# Patient Record
Sex: Female | Born: 1995 | Race: White | Hispanic: No | Marital: Single
Health system: Southern US, Community
[De-identification: ages and names within clinical notes are randomized; demographics above are authoritative.]

---

## 2019-06-09 ENCOUNTER — Other Ambulatory Visit: Payer: Self-pay

## 2019-06-09 DIAGNOSIS — Z20822 Contact with and (suspected) exposure to covid-19: Secondary | ICD-10-CM

## 2019-06-11 LAB — NOVEL CORONAVIRUS, NAA: SARS-CoV-2, NAA: NOT DETECTED

## 2019-11-16 ENCOUNTER — Ambulatory Visit: Payer: Self-pay | Attending: Internal Medicine

## 2019-11-16 DIAGNOSIS — Z23 Encounter for immunization: Secondary | ICD-10-CM

## 2019-11-16 NOTE — Progress Notes (Signed)
   Covid-19 Vaccination Clinic  Name:  Tamara Gallegos    MRN: [id number] DOB: Jun 05, 1996  11/16/2019  [name] was observed post Covid-19 immunization for 15 minutes without incident. She was provided with Vaccine Information Sheet and instruction to access the V-Safe system.   [name] was instructed to call 911 with any severe reactions post vaccine: Marland Kitchen Difficulty breathing  . Swelling of face and throat  . A fast heartbeat  . A bad rash all over body  . Dizziness and weakness   Immunizations Administered    Name Date Dose VIS Date Route   Pfizer COVID-19 Vaccine 11/16/2019 12:37 PM 0.3 mL 08/07/2019 Intramuscular   Manufacturer: [employer], [employer]   Lot: [id number]   NDC: [address]-6

## 2019-12-07 ENCOUNTER — Ambulatory Visit: Payer: Self-pay | Attending: Internal Medicine

## 2019-12-07 DIAGNOSIS — Z23 Encounter for immunization: Secondary | ICD-10-CM

## 2019-12-07 NOTE — Progress Notes (Signed)
   Covid-19 Vaccination Clinic  Name:  Tamara Gallegos    MRN: [id number] DOB: [birth year]  12/07/2019  Tamara Gallegos was observed post Covid-19 immunization for 15 minutes without incident. She was provided with Vaccine Information Sheet and instruction to access the V-Safe system.   Tamara Gallegos was instructed to call 911 with any severe reactions post vaccine: Marland Kitchen Difficulty breathing  . Swelling of face and throat  . A fast heartbeat  . A bad rash all over body  . Dizziness and weakness   Immunizations Administered    Name Date Dose VIS Date Route   Pfizer COVID-19 Vaccine 12/07/2019 12:09 PM 0.3 mL 08/07/2019 Intramuscular   Manufacturer: [employer], [employer]   Lot: [id number]   NDC: [address]-0

## 2021-01-01 ENCOUNTER — Emergency Department
Admission: EM | Admit: 2021-01-01 | Discharge: 2021-01-01 | Disposition: A | Discharge status: Home / Self Care | Payer: 59 | Attending: Emergency Medicine | Admitting: Emergency Medicine

## 2021-01-01 ENCOUNTER — Encounter: Payer: Self-pay | Admitting: Emergency Medicine

## 2021-01-01 ENCOUNTER — Other Ambulatory Visit: Payer: Self-pay

## 2021-01-01 ENCOUNTER — Emergency Department: Payer: 59

## 2021-01-01 DIAGNOSIS — S9032XA Contusion of left foot, initial encounter: Secondary | ICD-10-CM | POA: Insufficient documentation

## 2021-01-01 DIAGNOSIS — S52022A Displaced fracture of olecranon process without intraarticular extension of left ulna, initial encounter for closed fracture: Secondary | ICD-10-CM

## 2021-01-01 DIAGNOSIS — W108XXA Fall (on) (from) other stairs and steps, initial encounter: Secondary | ICD-10-CM | POA: Diagnosis not present

## 2021-01-01 DIAGNOSIS — S59902A Unspecified injury of left elbow, initial encounter: Secondary | ICD-10-CM | POA: Diagnosis present

## 2021-01-01 DIAGNOSIS — S52025A Nondisplaced fracture of olecranon process without intraarticular extension of left ulna, initial encounter for closed fracture: Secondary | ICD-10-CM | POA: Diagnosis not present

## 2021-01-01 MED ORDER — HYDROCODONE-ACETAMINOPHEN 5-325 MG PO TABS
1.0000 | ORAL_TABLET | Freq: Once | ORAL | Status: AC
  Administered 2021-01-01: 1 via ORAL
  Filled 2021-01-01: qty 1

## 2021-01-01 MED ORDER — TRAMADOL HCL 50 MG PO TABS: 50.0000 mg | ORAL_TABLET | Freq: Four times a day (QID) | ORAL | 0 refills | Status: DC | PRN

## 2021-01-01 MED ORDER — TRAMADOL HCL 50 MG PO TABS: 50.0000 mg | ORAL_TABLET | Freq: Four times a day (QID) | ORAL | 0 refills | Status: AC | PRN

## 2021-01-01 NOTE — ED Triage Notes (Signed)
Pt reports fell down some steps last night and hurt her left elbow. Pt reports unable to move left arm

## 2021-01-01 NOTE — Discharge Instructions (Signed)
Please call Dr. Rondel Baton office to request a follow up appointment.  You may use an ice pack over the splint to help with pain and swelling.  Take tylenol and ibuprofen as directed on packaging. If this does not help control your pain, take the Tramadol.  Return to the ER for symptoms of concern if unable to see the specialist.

## 2021-01-01 NOTE — ED Provider Notes (Signed)
[facility] Emergency Department Provider Note ____________________________________________  Time seen: Approximately 11:13 AM  I have reviewed the triage vital signs and the nursing notes.   HISTORY  Chief Complaint Fall and Arm Injury    HPI Tamara Gallegos is a 25 y.o. female who presents to the emergency department for evaluation and treatment of left elbow pain after slipping on wet stair step last night and falling. She landed on her left elbow. Now unable to fully extend arm. She denies shoulder or wrist pain. She does have a bruise on the left foot, but denies pain with ambulation. No alleviating measures prior to arrival.   History reviewed. No pertinent past medical history.  There are no problems to display for this patient.   History reviewed. No pertinent surgical history.  Prior to Admission medications   Medication Sig Start Date End Date Taking? Authorizing Provider  traMADol (ULTRAM) 50 MG tablet Take 1 tablet (50 mg total) by mouth every 6 (six) hours as needed. 01/01/21   Chinita Pester, FNP    Allergies Patient has no allergy information on record.  No family history on file.  Social History    Review of Systems Constitutional: Negative for fever. Cardiovascular: Negative for chest pain. Respiratory: Negative for shortness of breath. Musculoskeletal: Positive for left elbow pain Skin: Positive for contusion to left elbow and left ankle.  Neurological: Negative for decrease in sensation  ____________________________________________   PHYSICAL EXAM:  VITAL SIGNS: ED Triage Vitals  Enc Vitals Group     BP 149/99     Pulse 84     Resp 18     Temp 98     Temp src      SpO2 99     Weight      Height      Head Circumference      Peak Flow      Pain Score      Pain Loc      Pain Edu?      Excl. in GC?     Constitutional: Alert and oriented. Well appearing and in no acute distress. Eyes: Conjunctivae are clear  without discharge or drainage Head: Atraumatic Neck: Supple. No midline tenderness. Respiratory: No cough. Respirations are even and unlabored. Musculoskeletal: Limited extension of the left elbow with diffuse edema. No obvious deformity. No shoulder or wrist pain. Full ROM of the left foot/ankle. Neurologic: Motor and sensory function intact.  Skin: No open wounds over the left upper or lower extremity.  Psychiatric: Affect and behavior are appropriate.  ____________________________________________   LABS (all labs ordered are listed, but only abnormal results are displayed)  Labs Reviewed - No data to display ____________________________________________  RADIOLOGY  Nondisplaced, noncomminuted fracture of ulna olecranon without other fractures or dislocation.  Joint effusion is present.  I, Kem Boroughs, personally viewed and evaluated these images (plain radiographs) as part of my medical decision making, as well as reviewing the written report by the radiologist.  DG Elbow Complete Left  Result Date: 01/01/2021 CLINICAL DATA:  Larey Seat last night.  Left elbow pain and swelling. EXAM: LEFT ELBOW - COMPLETE 3+ VIEW COMPARISON:  None. FINDINGS: Nondisplaced, non comminuted fracture across the olecranon. No other fractures.  Elbow joint normally spaced and aligned. Positive joint effusion and mild posterior soft tissue swelling. IMPRESSION: 1. Nondisplaced, non comminuted fracture of the ulna olecranon. No other fractures. No dislocation. Positive joint effusion. Electronically Signed   By: Amie Portland M.D.   On:  01/01/2021 11:52   ____________________________________________   PROCEDURES  .Ortho Injury Treatment  Date/Time: 01/01/2021 4:33 PM Performed by: Chinita Pester, FNP Authorized by: Chinita Pester, FNP   Consent:    Consent obtained:  Verbal   Consent given by:  Patient   Risks discussed:  Restricted joint movement and stiffnessInjury location: elbow Location  details: left elbow Injury type: fracture Fracture type: olecranon process Pre-procedure neurovascular assessment: neurovascularly intact Pre-procedure distal perfusion: normal Pre-procedure neurological function: normal Pre-procedure range of motion: reduced Manipulation performed: no Immobilization: splint Splint type: long arm Splint Applied by: ED Tech Supplies used: elastic bandage,  cotton padding and Ortho-Glass Post-procedure neurovascular assessment: post-procedure neurovascularly intact Post-procedure distal perfusion: normal Post-procedure neurological function: normal Post-procedure range of motion: unchanged Comments: Follow-up will be greater than 24 hours.  Initial fracture care provided in the emergency department.     ____________________________________________   INITIAL IMPRESSION / ASSESSMENT AND PLAN / ED COURSE  Tamara Gallegos is a 25 y.o. who presents to the emergency department for evaluation after mechanical fall last night. See HPI.  X-ray does show a nondisplaced, noncomminuted fracture along the olecranon process of the left elbow as well as a significant joint effusion.  She was placed in a long-arm OCL.  Orthopedic referral provided upon discharge.  She will also be given pain medicine to be taken if Tylenol and ibuprofen are not working.  Patient instructed to follow-up with orthopedics.  She was also instructed to return to the emergency department for symptoms that change or worsen if unable schedule an appointment with orthopedics or primary care.  Medications  HYDROcodone-acetaminophen (NORCO/VICODIN) 5-325 MG per tablet 1 tablet (1 tablet Oral Given 01/01/21 1240)    Pertinent labs & imaging results that were available during my care of the patient were reviewed by me and considered in my medical decision making (see chart for details).   _________________________________________   FINAL CLINICAL IMPRESSION(S) / ED DIAGNOSES  Final  diagnoses:  Closed fracture of olecranon process of left ulna, initial encounter    ED Discharge Orders         Ordered    traMADol (ULTRAM) 50 MG tablet  Every 6 hours PRN,   Status:  Discontinued        01/01/21 1233    traMADol (ULTRAM) 50 MG tablet  Every 6 hours PRN        01/01/21 1234           If controlled substance prescribed during this visit, 12 month history viewed on the [facility] prior to issuing an initial prescription for Schedule II or III opiod.   Chinita Pester, FNP 01/01/21 1636    Merwyn Katos, MD 01/02/21 [phone]

## 2022-01-04 IMAGING — DX DG ELBOW COMPLETE 3+V*L*
4 series · 4 of 4 positions shown · non-contrast
Comparison: None.

CLINICAL DATA: Fell last night.  Left elbow pain and swelling.

EXAM:
LEFT ELBOW - COMPLETE 3+ VIEW

[elbow lat (1 of 2)]
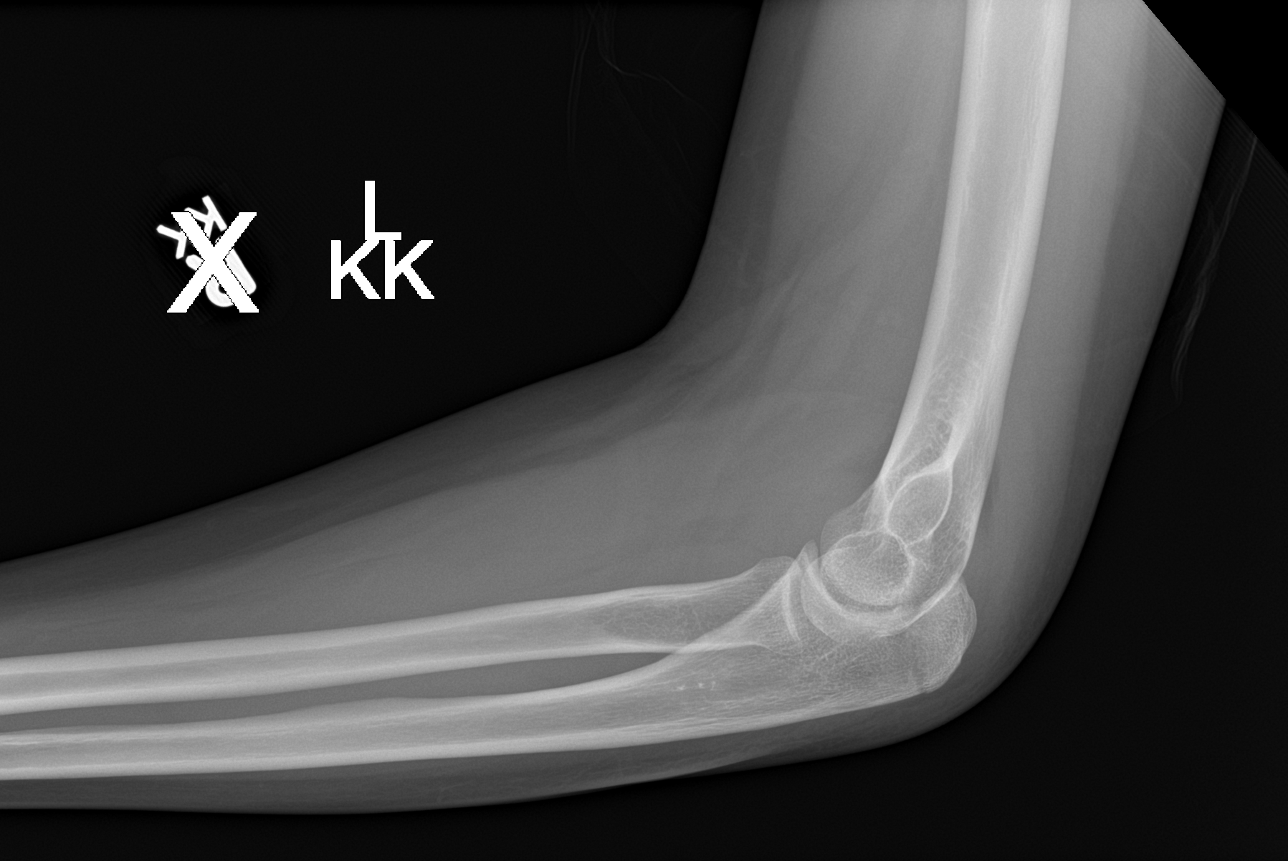

[elbow obl]
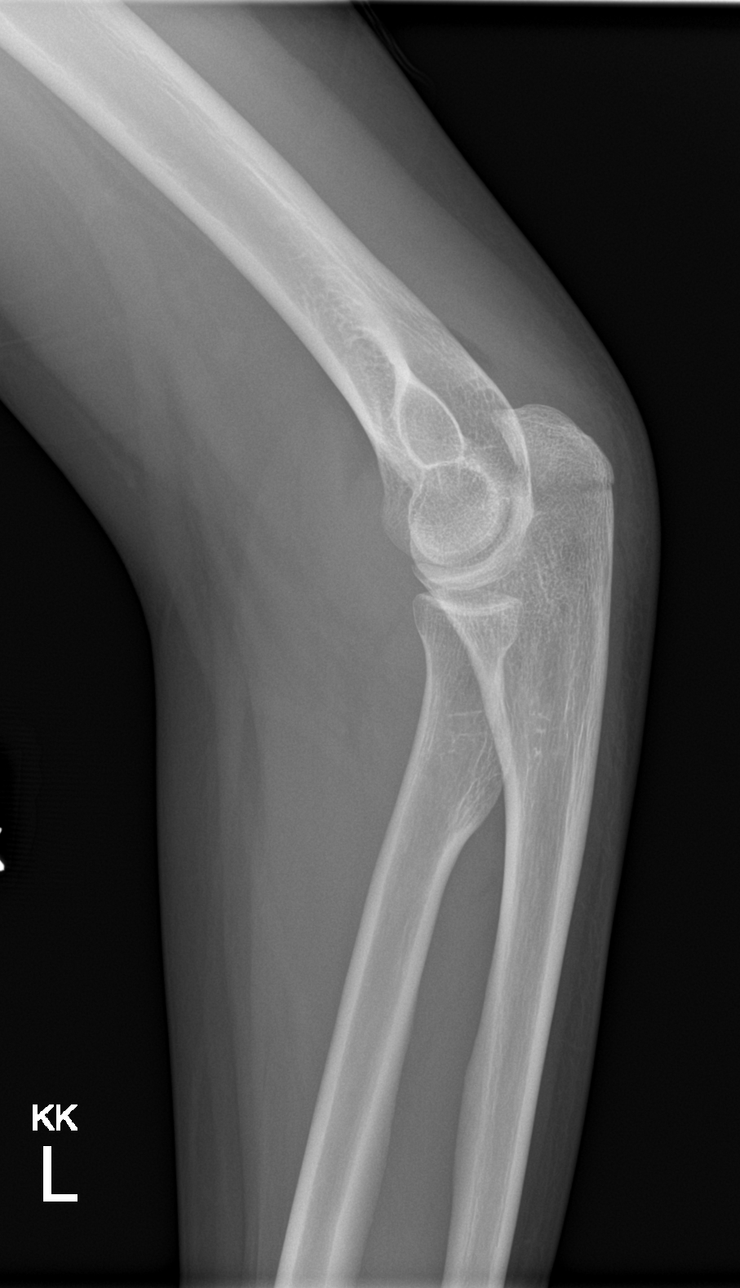

[elbow ap]
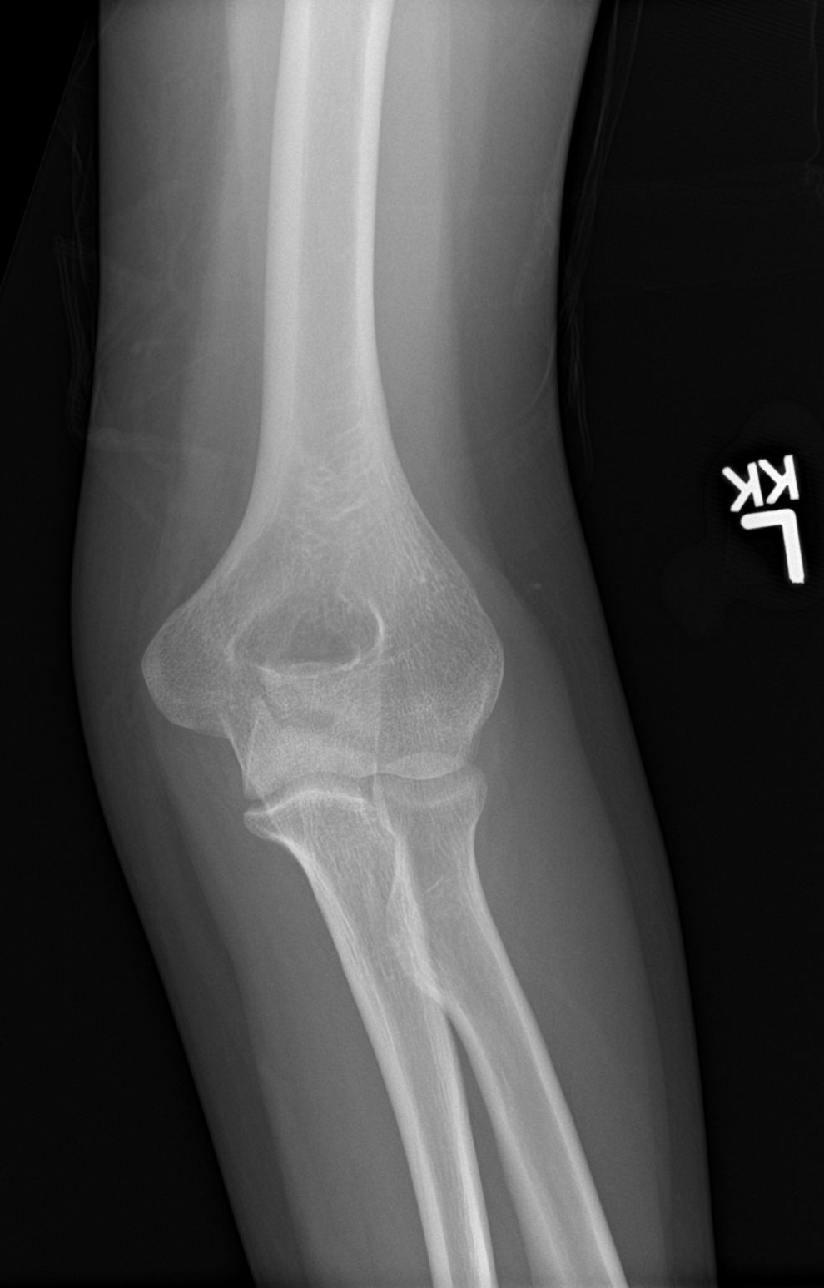

[elbow lat (2 of 2)]
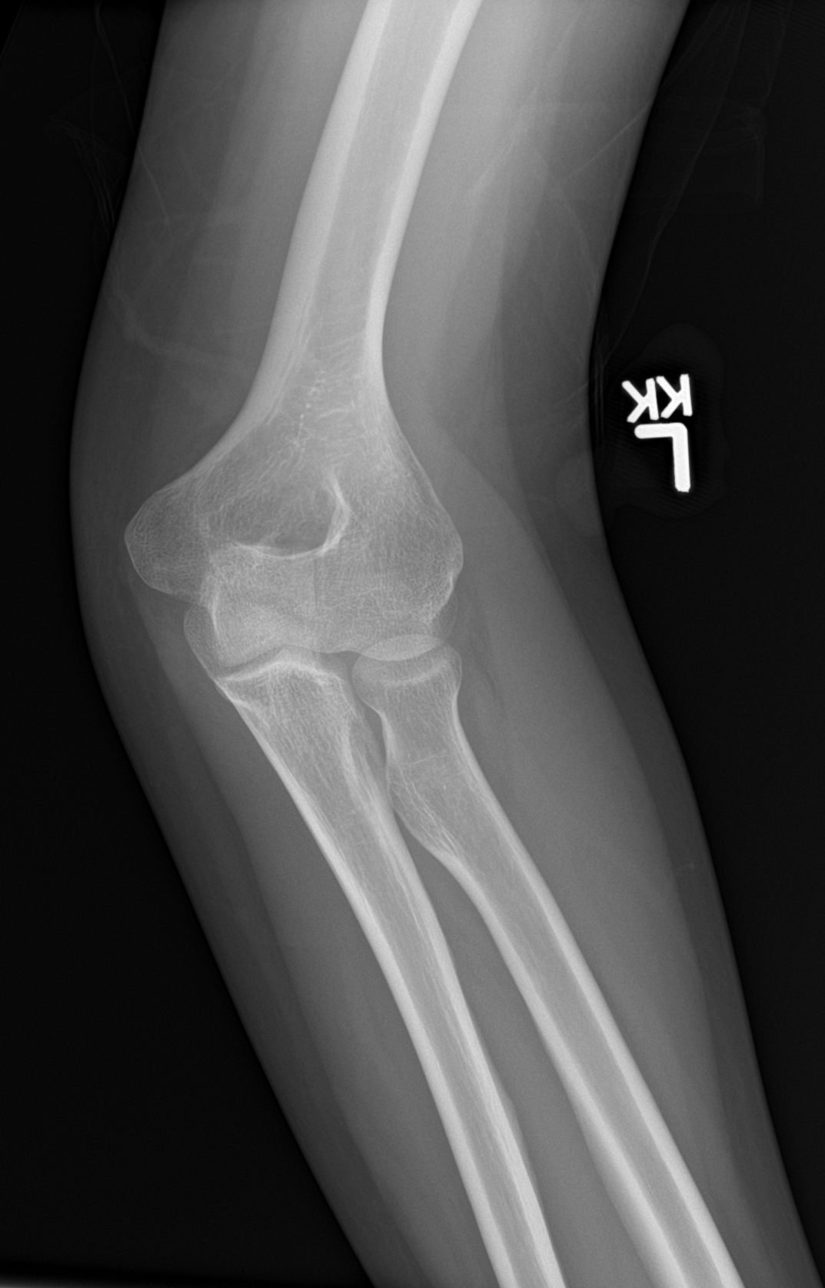

[4 of 4 positions shown; findings below may reference images not displayed]

FINDINGS: Nondisplaced, non comminuted fracture across the olecranon.

No other fractures.  Elbow joint normally spaced and aligned.

Positive joint effusion and mild posterior soft tissue swelling.
IMPRESSION: 1. Nondisplaced, non comminuted fracture of the ulna olecranon. No
other fractures. No dislocation. Positive joint effusion.
